# Patient Record
Sex: Male | Born: 1990 | Race: White | Hispanic: No | Marital: Single | Smoking: Never smoker
Health system: Southern US, Community
[De-identification: ages and names within clinical notes are randomized; demographics above are authoritative.]

---

## 2016-07-30 ENCOUNTER — Emergency Department (HOSPITAL_BASED_OUTPATIENT_CLINIC_OR_DEPARTMENT_OTHER): Payer: Self-pay

## 2016-07-30 ENCOUNTER — Encounter (HOSPITAL_BASED_OUTPATIENT_CLINIC_OR_DEPARTMENT_OTHER): Payer: Self-pay | Admitting: Emergency Medicine

## 2016-07-30 ENCOUNTER — Emergency Department (HOSPITAL_BASED_OUTPATIENT_CLINIC_OR_DEPARTMENT_OTHER)
Admission: EM | Admit: 2016-07-30 | Discharge: 2016-07-30 | Disposition: A | Discharge status: Home / Self Care | Payer: Self-pay | Attending: Emergency Medicine | Admitting: Emergency Medicine

## 2016-07-30 DIAGNOSIS — R002 Palpitations: Secondary | ICD-10-CM

## 2016-07-30 DIAGNOSIS — I493 Ventricular premature depolarization: Secondary | ICD-10-CM | POA: Insufficient documentation

## 2016-07-30 DIAGNOSIS — R0600 Dyspnea, unspecified: Secondary | ICD-10-CM | POA: Insufficient documentation

## 2016-07-30 LAB — BASIC METABOLIC PANEL
ANION GAP: 10 (ref 5–15)
BUN: 8 mg/dL (ref 6–20)
CALCIUM: 9 mg/dL (ref 8.9–10.3)
CO2: 23 mmol/L (ref 22–32)
Chloride: 105 mmol/L (ref 101–111)
Creatinine, Ser: 0.71 mg/dL (ref 0.61–1.24)
Glucose, Bld: 97 mg/dL (ref 65–99)
Potassium: 3.7 mmol/L (ref 3.5–5.1)
SODIUM: 138 mmol/L (ref 135–145)

## 2016-07-30 LAB — CBC
HCT: 43.9 % (ref 39.0–52.0)
HEMOGLOBIN: 15.3 g/dL (ref 13.0–17.0)
MCH: 29.9 pg (ref 26.0–34.0)
MCHC: 34.9 g/dL (ref 30.0–36.0)
MCV: 85.7 fL (ref 78.0–100.0)
Platelets: 346 10*3/uL (ref 150–400)
RBC: 5.12 MIL/uL (ref 4.22–5.81)
RDW: 12.6 % (ref 11.5–15.5)
WBC: 11.1 10*3/uL — ABNORMAL HIGH (ref 4.0–10.5)

## 2016-07-30 LAB — TROPONIN I

## 2016-07-30 NOTE — ED Provider Notes (Signed)
[facility] Provider Note   CSN: [id number] Arrival date & time: 07/30/16  4098  By signing my name below, I, Deland Pretty, attest that this documentation has been prepared under the direction and in the presence of Derwood Kaplan, MD. Electronically Signed: Deland Pretty, ED Scribe. 07/30/16. 9:08 PM.  History   Chief Complaint Chief Complaint  Patient presents with  . Chest Pain   The history is provided by the patient and a parent. No language interpreter was used.   HPI Comments: Oscar Rivas is a 26 y.o. male asthma who presents to the Emergency Department complaining of "pounding" waxing and waning palpitations, "throbbing" central chest pain, and intermittent left lower "sharp" chest pain. He reports associated intermittent SOB. Laying down exacerbates his pain.  Pt attests that he drinks sodas 3x a day. Per mother, her son often spends his time indoors and does not sleep on a regular schedule. The pt has a FHx of heart problems >55. The pt denies a h/x of CA, recent travel, PE, DVT, tobacco abuse, alcohol abuse, and recreational drug abuse.  History reviewed. No pertinent past medical history.  There are no active problems to display for this patient.   History reviewed. No pertinent surgical history.     Home Medications    Prior to Admission medications   Not on File    Family History No family history on file.  Social History Social History  Substance Use Topics  . Smoking status: Never Smoker  . Smokeless tobacco: Never Used  . Alcohol use No     Allergies   Patient has no known allergies.   Review of Systems Review of Systems  Respiratory: Positive for shortness of breath.   Cardiovascular: Positive for chest pain and palpitations.     Physical Exam Updated Vital Signs BP 126/90   Pulse 69   Temp 99 F (37.2 C) (Oral)   Resp (!) 27   Ht 5\' 8"  (1.727 m)   Wt 117 kg (258 lb)   SpO2 100%   BMI 39.23 kg/m    Physical Exam  Constitutional: He is oriented to person, place, and time. He appears well-developed and well-nourished.  HENT:  Head: Normocephalic.  Eyes: EOM are normal.  Neck: Normal range of motion.  Cardiovascular: Normal rate, regular rhythm, normal heart sounds and intact distal pulses.  Exam reveals no gallop and no friction rub.   No murmur heard. No pitting edema.  Pulmonary/Chest: Effort normal and breath sounds normal. No respiratory distress. He has no wheezes. He has no rales.  No JVD. No unilateral calf swelling or tenderness.  Abdominal: He exhibits no distension.  Musculoskeletal: Normal range of motion.  Neurological: He is alert and oriented to person, place, and time.  Psychiatric: He has a normal mood and affect.  Nursing note and vitals reviewed.    ED Treatments / Results   DIAGNOSTIC STUDIES: Oxygen Saturation is 98% on RA, normal by my interpretation.   COORDINATION OF CARE: 8:59 PM-Discussed next steps with pt. Pt verbalized understanding and is agreeable with the plan.   Labs (all labs ordered are listed, but only abnormal results are displayed) Labs Reviewed  CBC - Abnormal; Notable for the following:       Result Value   WBC 11.1 (*)    All other components within normal limits  BASIC METABOLIC PANEL  TROPONIN I    EKG  EKG Interpretation  Date/Time:  Saturday July 30 2016 18:28:55 EDT Ventricular Rate:  92 PR Interval:  150 QRS Duration: 94 QT Interval:  340 QTC Calculation: 420 R Axis:   47 Text Interpretation:  Sinus rhythm with occasional Premature ventricular complexes Otherwise normal ECG s1q3t3 Premature ventricular complexes No old tracing to compare No acute changes Confirmed by Derwood KaplanNanavati, Tammye Kahler [phone]([id number]) on 07/30/2016 8:20:32 PM       Radiology Dg Chest 2 View  Result Date: 07/30/2016 CLINICAL DATA:  Left-sided chest pain and shortness of breath EXAM: CHEST  2 VIEW COMPARISON:  None. FINDINGS: Normal heart size and  mediastinal contours. No acute infiltrate or edema. No effusion or pneumothorax. No acute osseous findings. IMPRESSION: No evidence of active disease. Electronically Signed   By: Marnee SpringJonathon  Watts M.D.   On: 07/30/2016 18:48    Procedures Procedures (including critical care time)  Medications Ordered in ED Medications - No data to display   Initial Impression / Assessment and Plan / ED Course  I have reviewed the triage vital signs and the nursing notes.  Pertinent labs & imaging results that were available during my care of the patient were reviewed by me and considered in my medical decision making (see chart for details).     Pt comes in with cc of chest pain. Pt's chest pain is not typical of ACS. Pt is also feeling palpitations and he has clear PVC. Trop is neg. EKG has no acute changes. Pt's EKG shows s1q3t3 and PVCs. Pt has WELLS score of 0 and is PERC neg. We advised pt to come to the ER is shortness of breath gets worse. Pt's symptoms not consistent with pericarditis either. Advised to decreased caffeine intake, and be more physically active (mother reports that pt never leaves home). If the symptoms persists, then he needs to see Cards. If the symptoms get worse, particularly shortness of breath - he will return to the ER.   Final Clinical Impressions(s) / ED Diagnoses   Final diagnoses:  Frequent PVCs  Palpitations  Dyspnea, unspecified type    New Prescriptions There are no discharge medications for this patient.  I personally performed the services described in this documentation, which was scribed in my presence. The recorded information has been reviewed and is accurate.     Derwood KaplanNanavati, Ciarra Braddy, MD 07/31/16 [phone]

## 2016-07-30 NOTE — ED Triage Notes (Signed)
Chest pain since yesterday, described as sharp, with SOB. States he vomited 2 days ago.

## 2016-07-30 NOTE — Discharge Instructions (Signed)
We saw you in the ER for the chest pain, palpitations. All the results in the ER are normal, labs and imaging. We are not sure what is causing your symptoms. We recommend decreasing caffeine in the diet and sleeping better, being more active. If the symptoms continue, see the cardiology doctors. Please return to the ER if you have worsening chest pain, shortness of breath, pain radiating to your jaw, shoulder, or back, sweats or fainting. Otherwise see the Cardiologist or your primary care doctor as requested.

## 2018-01-26 IMAGING — CR DG CHEST 2V
2 series · 2 of 2 positions shown · non-contrast
Comparison: None.

CLINICAL DATA: Left-sided chest pain and shortness of breath

EXAM:
CHEST  2 VIEW

[w chest pa]
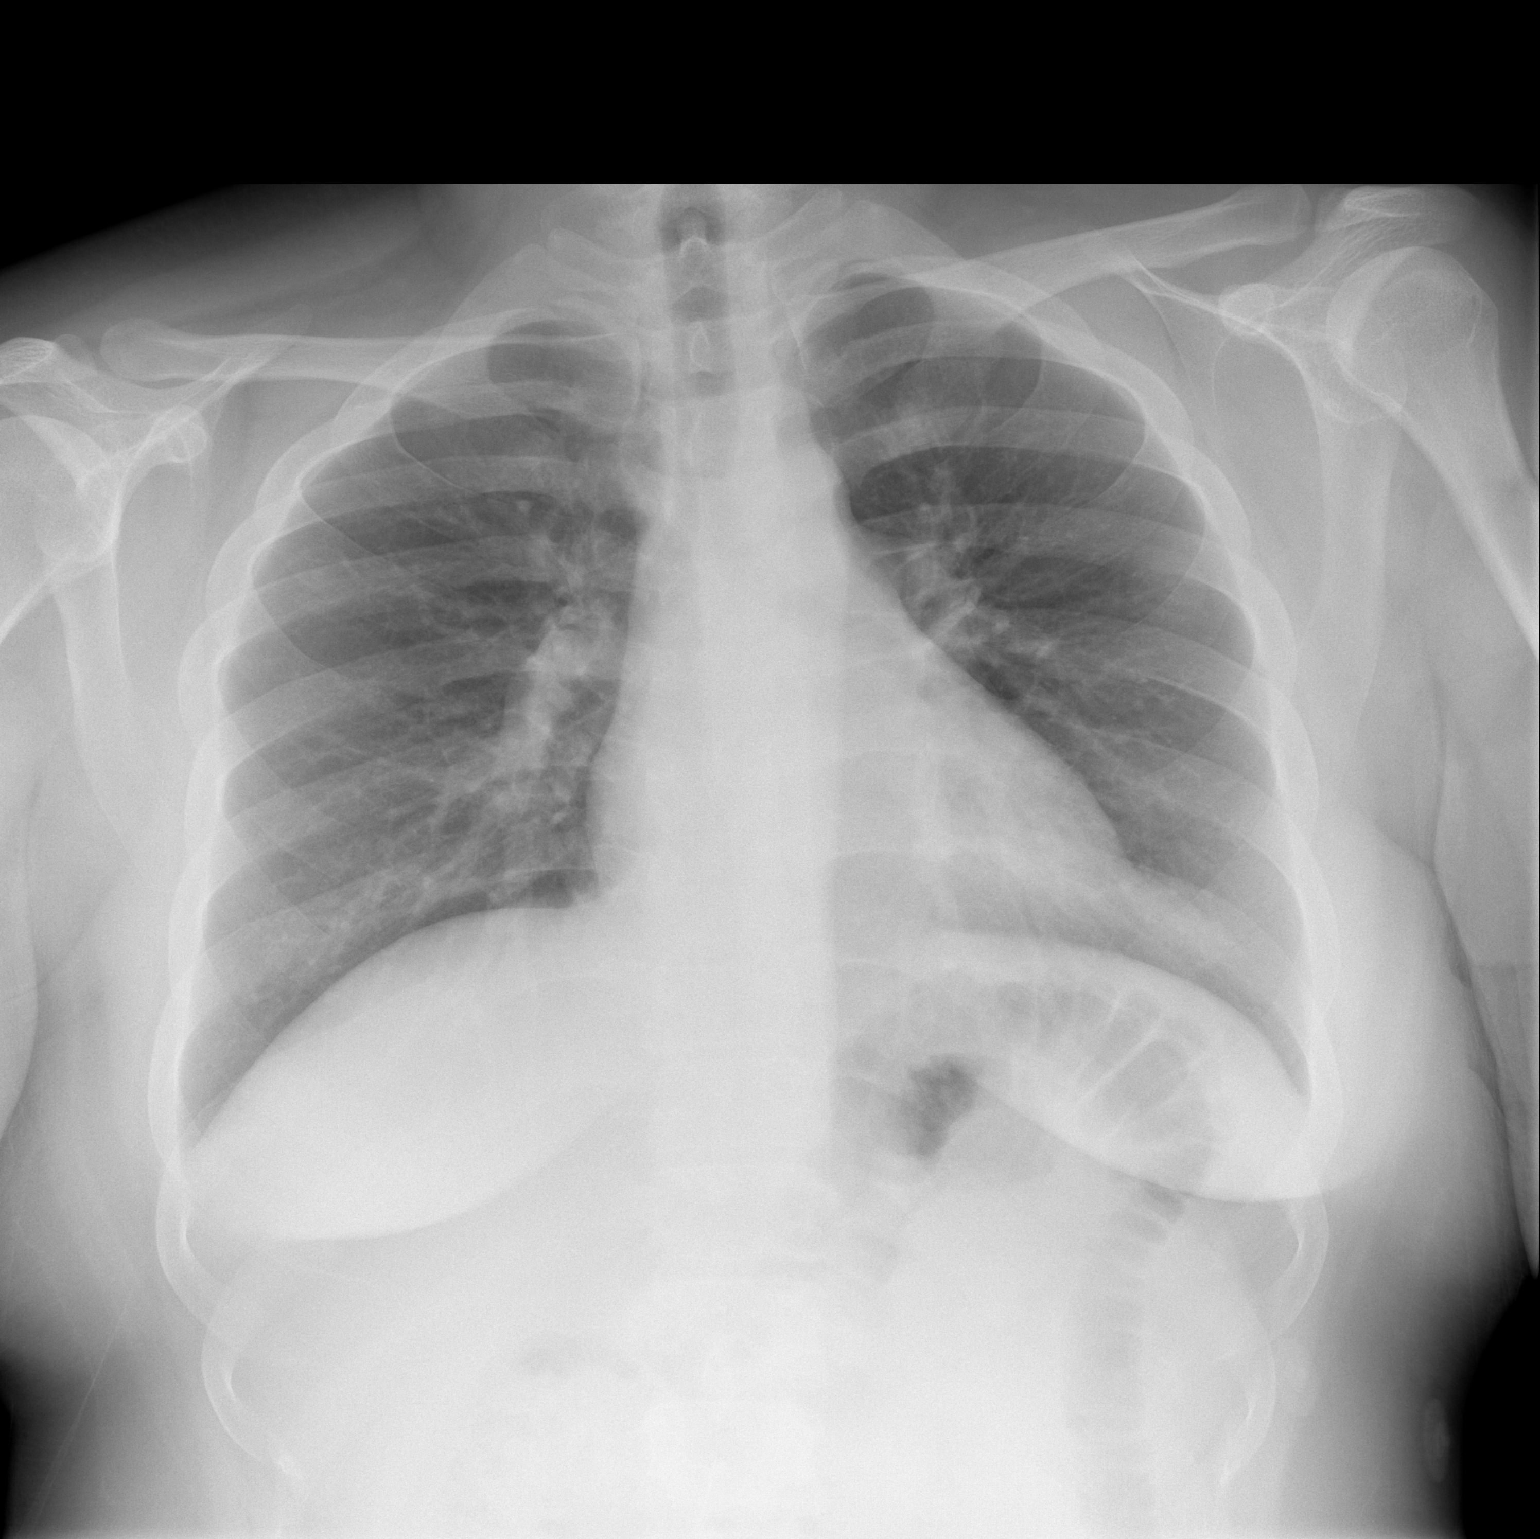

[w chest lat]
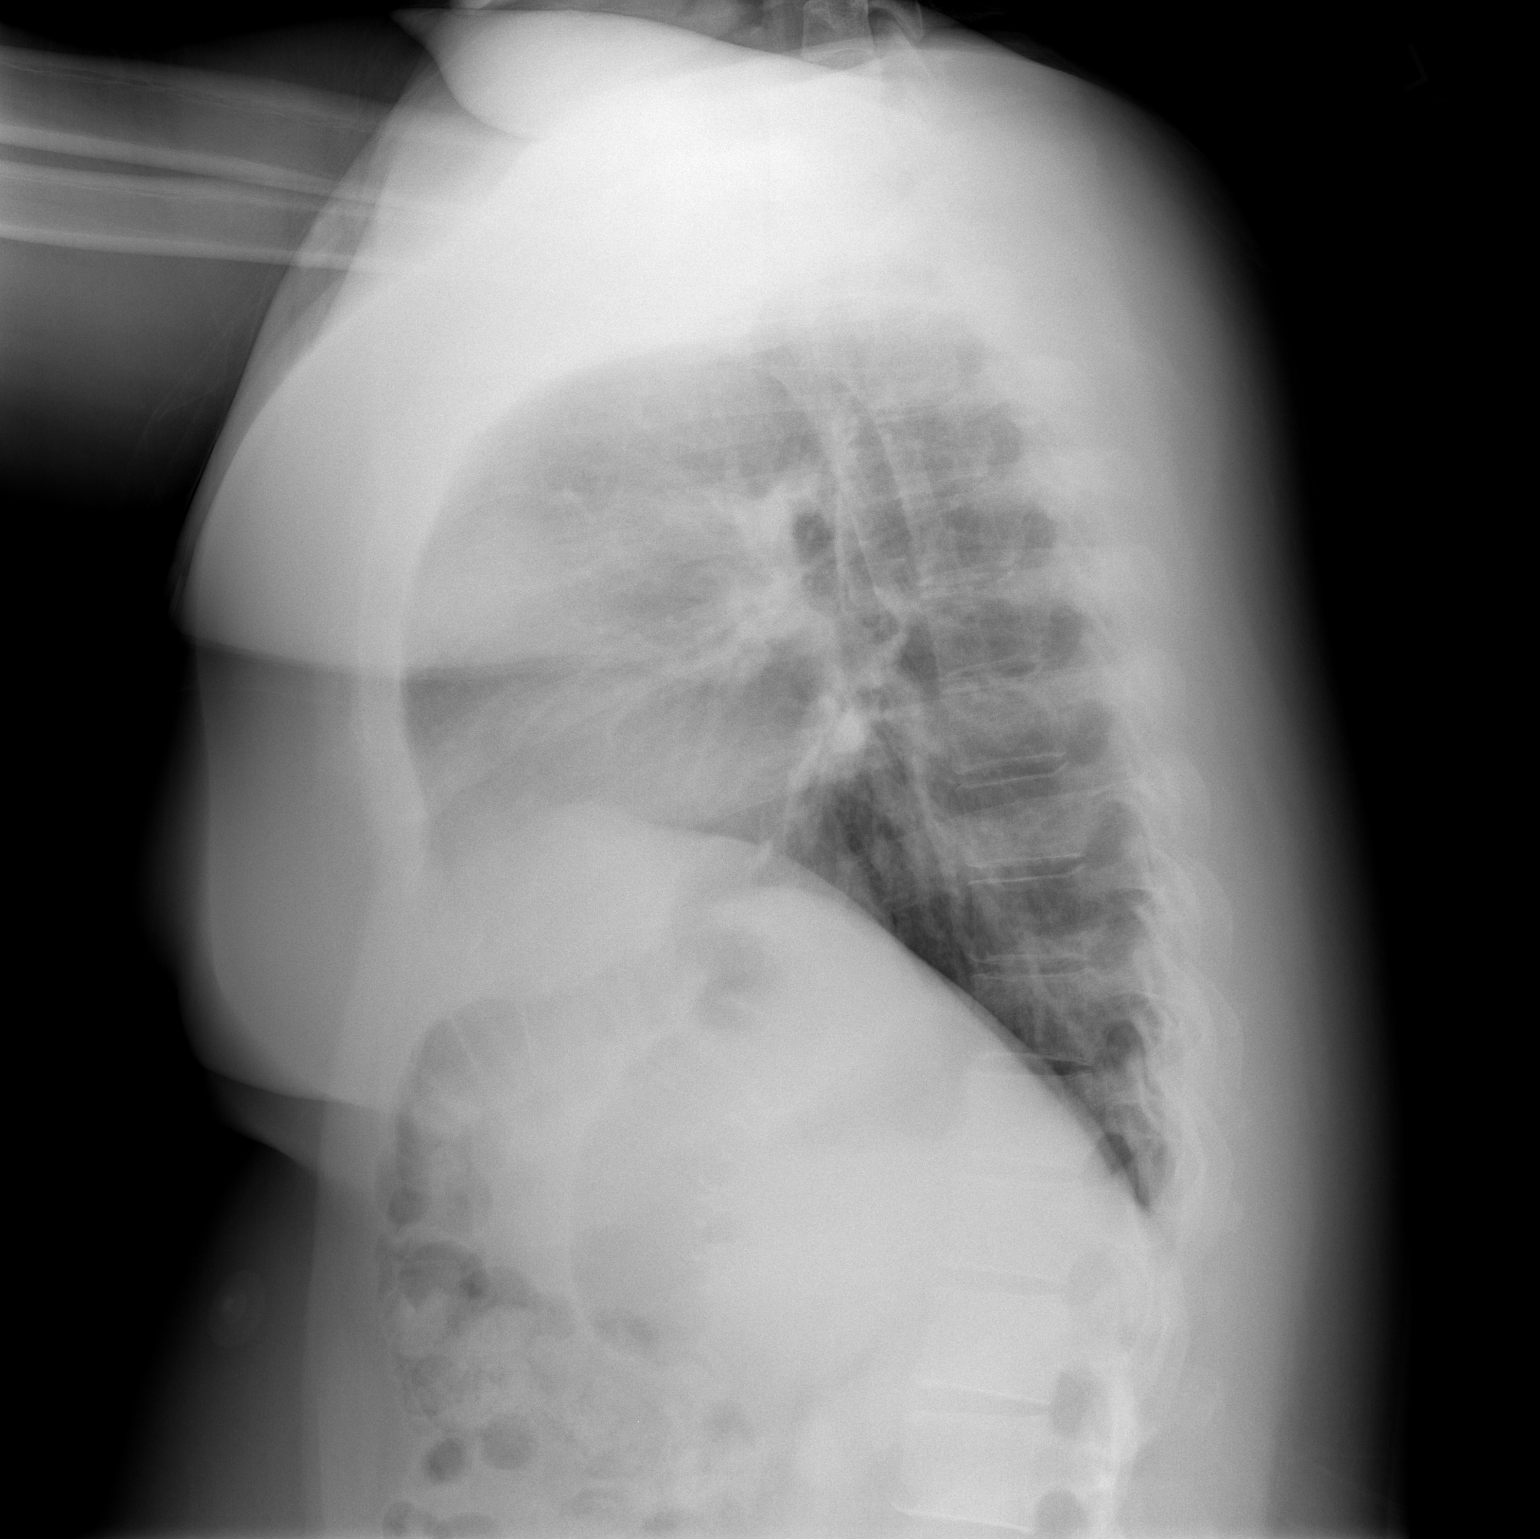

[2 of 2 positions shown; findings below may reference images not displayed]

FINDINGS: Normal heart size and mediastinal contours. No acute infiltrate or
edema. No effusion or pneumothorax. No acute osseous findings.
IMPRESSION: No evidence of active disease.
